# Patient Record
Sex: Male | Born: 1975 | Hispanic: Yes | Marital: Married | Smoking: Never smoker
Health system: Southern US, Community
[De-identification: ages and names within clinical notes are randomized; demographics above are authoritative.]

---

## 2015-12-18 ENCOUNTER — Encounter: Payer: Self-pay | Admitting: Orthopedic Surgery

## 2015-12-18 ENCOUNTER — Ambulatory Visit: Payer: Self-pay | Admitting: Orthopedic Surgery

## 2016-06-29 ENCOUNTER — Encounter (HOSPITAL_COMMUNITY): Payer: Self-pay | Admitting: *Deleted

## 2016-06-29 DIAGNOSIS — R071 Chest pain on breathing: Secondary | ICD-10-CM | POA: Insufficient documentation

## 2016-06-29 NOTE — ED Triage Notes (Signed)
THE PT IS C/O CENTRAL CHEST PAIN  FOR 7 NDAYS GRADUALLY GETTING WORSE  EACH DAY  .  WORSE TODAY SOME SOB AND DIZZINESS

## 2016-06-30 ENCOUNTER — Encounter (HOSPITAL_COMMUNITY): Payer: Self-pay | Admitting: Radiology

## 2016-06-30 ENCOUNTER — Emergency Department (HOSPITAL_COMMUNITY): Payer: Self-pay

## 2016-06-30 ENCOUNTER — Emergency Department (HOSPITAL_COMMUNITY)
Admission: EM | Admit: 2016-06-30 | Discharge: 2016-06-30 | Disposition: A | Discharge status: Home / Self Care | Payer: Self-pay | Attending: Emergency Medicine | Admitting: Emergency Medicine

## 2016-06-30 DIAGNOSIS — R079 Chest pain, unspecified: Secondary | ICD-10-CM

## 2016-06-30 LAB — BASIC METABOLIC PANEL
Anion gap: 5 (ref 5–15)
BUN: 8 mg/dL (ref 6–20)
CHLORIDE: 106 mmol/L (ref 101–111)
CO2: 27 mmol/L (ref 22–32)
CREATININE: 0.78 mg/dL (ref 0.61–1.24)
Calcium: 9.3 mg/dL (ref 8.9–10.3)
GFR calc Af Amer: >60 mL/min (ref >60)
GFR calc non Af Amer: >60 mL/min (ref >60)
GLUCOSE: 121 mg/dL — AB (ref 65–99)
POTASSIUM: 3.8 mmol/L (ref 3.5–5.1)
Sodium: 138 mmol/L (ref 135–145)

## 2016-06-30 LAB — I-STAT TROPONIN, ED: Troponin i, poc: 0.01 ng/mL (ref 0.00–0.08)

## 2016-06-30 LAB — D-DIMER, QUANTITATIVE: D-Dimer, Quant: 0.39 ug/mL-FEU (ref 0.00–0.50)

## 2016-06-30 LAB — CBC
HEMATOCRIT: 43.3 % (ref 39.0–52.0)
Hemoglobin: 14.7 g/dL (ref 13.0–17.0)
MCH: 28.8 pg (ref 26.0–34.0)
MCHC: 33.9 g/dL (ref 30.0–36.0)
MCV: 84.9 fL (ref 78.0–100.0)
PLATELETS: 299 10*3/uL (ref 150–400)
RBC: 5.1 MIL/uL (ref 4.22–5.81)
RDW: 13.6 % (ref 11.5–15.5)
WBC: 6.8 10*3/uL (ref 4.0–10.5)

## 2016-06-30 LAB — TROPONIN I: Troponin I: <0.03 ng/mL (ref <0.03)

## 2016-06-30 MED ORDER — SODIUM CHLORIDE 0.9 % IV BOLUS (SEPSIS)
1000.0000 mL | Freq: Once | INTRAVENOUS | Status: AC
  Administered 2016-06-30: 1000 mL via INTRAVENOUS

## 2016-06-30 MED ORDER — SODIUM CHLORIDE 0.9 % IV BOLUS (SEPSIS)
1000.0000 mL | INTRAVENOUS | Status: AC
  Administered 2016-06-30: 1000 mL via INTRAVENOUS

## 2016-06-30 MED ORDER — IOPAMIDOL (ISOVUE-370) INJECTION 76%
INTRAVENOUS | Status: AC
  Administered 2016-06-30: 100 mL
  Filled 2016-06-30: qty 100

## 2016-06-30 MED ORDER — CYCLOBENZAPRINE HCL 10 MG PO TABS: 10.0000 mg | ORAL_TABLET | Freq: Three times a day (TID) | ORAL | 0 refills | Status: DC | PRN

## 2016-06-30 NOTE — Discharge Instructions (Signed)
1. Medications: usual home medications 2. Treatment: rest, drink plenty of fluids,  3. Follow Up: Please followup with your primary doctor in 1-2 days for discussion of your diagnoses and further evaluation after today's visit; if you do not have a primary care doctor use the resource guide provided to find one; Please return to the ER for worsening pain or other concerns

## 2016-06-30 NOTE — ED Provider Notes (Signed)
[facility]EMER[facility]Y DEPT Provider Note   CSN: [id number] Arrival date & time: 06/29/16  2342     History   Chief Complaint Chief Complaint  Patient presents with  . Chest Pain    HPI Cory Melendez is a 40 y.o. male with no major medical problems, nonsmoker presents to the Emergency Department complaining of intermittent left sided chest pain onset 1 week ago, acutely worsening and becoming constant 3 days ago.  He rates his pain at 6/10.  Pt reports the pain is exacerbated by deep breathing and movement of the left shoulder.  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  Pt reports taking ibuprofen without moderate relief of the pain.  No cardiac hx, no hx of DVT, no recent immobilization, leg swelling, calf pain. Pt denies associated SOB, diaphoresis, nausea, vomiting, weakness, syncope.  Pt reports he did feel dizzy and lightheaded 2 days ago, But this has not returned.    The history is provided by the patient and medical records. No language interpreter was used.    History reviewed. No pertinent past medical history.  There are no active problems to display for this patient.   History reviewed. No pertinent surgical history.     Home Medications    Prior to Admission medications   Medication Sig Start Date End Date Taking? Authorizing Provider  ibuprofen (ADVIL,MOTRIN) 200 MG tablet Take 200 mg by mouth every 6 (six) hours as needed for mild pain.   Yes Historical Provider, MD    Family History No family history on file.  Social History Social History  Substance Use Topics  . Smoking status: Never Smoker  . Smokeless tobacco: Never Used  . Alcohol use No     Allergies   Review of patient's allergies indicates no known allergies.   Review of Systems Review of Systems  Cardiovascular: Positive for chest pain.  All other systems reviewed and are negative.    Physical Exam Updated Vital Signs BP 93/70   Pulse  (!) 54   Temp 97.7 F (36.5 C) (Oral)   Resp 12   Ht 5\' 3"  (1.6 m)   Wt 63 kg   SpO2 100%   BMI 24.62 kg/m   Physical Exam  Constitutional: He appears well-developed and well-nourished. No distress.  Awake, alert, nontoxic appearance  HENT:  Head: Normocephalic and atraumatic.  Mouth/Throat: Oropharynx is clear and moist. No oropharyngeal exudate.  Eyes: Conjunctivae are normal. No scleral icterus.  Neck: Normal range of motion. Neck supple.  Cardiovascular: Normal rate, regular rhythm and intact distal pulses.   Pulmonary/Chest: Effort normal and breath sounds normal. No respiratory distress. He has no wheezes. He exhibits tenderness.    Equal chest expansion Clear and equal breath sounds Tenderness to palpation in the left chest  Abdominal: Soft. Bowel sounds are normal. He exhibits no mass. There is no tenderness. There is no rebound and no guarding.  Musculoskeletal: Normal range of motion. He exhibits no edema.  Left shoulder: Full range of motion with exacerbation of left-sided chest pain but no shoulder or arm pain. No joint line tenderness.  Neurological: He is alert.  Speech is clear and goal oriented Moves extremities without ataxia  Skin: Skin is warm and dry. He is not diaphoretic.  Psychiatric: He has a normal mood and affect.  Nursing note and vitals reviewed.    ED Treatments / Results  Labs (all labs ordered are listed, but only abnormal results are displayed)  Labs Reviewed  BASIC METABOLIC PANEL - Abnormal; Notable for the following:       Result Value   Glucose, Bld 121 (*)    All other components within normal limits  CBC  TROPONIN I  D-DIMER, QUANTITATIVE (NOT AT [facility])  I-STAT TROPOININ, ED    EKG  EKG Interpretation  Date/Time:  Saturday June 29 2016 23:47:16 EDT Ventricular Rate:  70 PR Interval:  156 QRS Duration: 88 QT Interval:  396 QTC Calculation: 427 R Axis:   -30 Text Interpretation:  Normal sinus rhythm Left axis  deviation Abnormal ECG No old tracing to compare Confirmed by GOLDSTON MD, SCOTT [phone]) on 06/30/2016 12:53:35 AM       Radiology Dg Chest 2 View  Result Date: 06/30/2016 CLINICAL DATA:  Intermittent chest pain for 7 days. Shortness of breath and dizziness. EXAM: CHEST  2 VIEW COMPARISON:  None. FINDINGS: Shallow inspiration. The heart size and mediastinal contours are within normal limits. Both lungs are clear. The visualized skeletal structures are unremarkable. IMPRESSION: No active cardiopulmonary disease. Electronically Signed   By: Burman Nieves M.D.   On: 06/30/2016 01:16   Ct Angio Chest/abd/pel For Dissection W And/or W/wo  Result Date: 06/30/2016 CLINICAL DATA:  Chest pain radiating to LEFT back, hypotension for 1 week. EXAM: CT ANGIOGRAPHY CHEST, ABDOMEN AND PELVIS TECHNIQUE: Multidetector CT imaging through the chest, abdomen and pelvis was performed using the standard protocol during bolus administration of intravenous contrast. Multiplanar reconstructed images and MIPs were obtained and reviewed to evaluate the vascular anatomy. CONTRAST:  100 cc Isovue 370 COMPARISON:  Chest radiograph June 30, 2016 at 0042 hours FINDINGS: CTA CHEST FINDINGS CARDIOVASCULAR: Cardiac motion through root of aorta. Thoracic aorta is normal course and caliber. No intrinsic density on noncontrast CT. Homogeneous contrast opacification of thoracic aorta without dissection, aneurysm, luminal irregularity, periaortic fluid collections, or contrast extravasation. Heart size is normal. No pericardial effusion. MEDIASTINUM/NODES: No mediastinal mass or lymphadenopathy by CT size criteria. Small calcified LEFT mediastinal lymph nodes. LUNGS/PLEURA: Tracheobronchial tree is patent, no pneumothorax. Low lung volumes. Dependent atelectasis without pleural effusion or focal consolidation. MUSCULOSKELETAL:  Nonsuspicious.  Negative thoracic spine. Review of the MIP images confirms the above findings. CTA ABDOMEN AND  PELVIS FINDINGS ARTERIES: Abdominal aorta is normal course and caliber. Trace calcific atherosclerosis Homogeneous contrast opacification of aortoiliac vessels without dissection, aneurysm, luminal irregularity, periaortic fluid collections, or contrast extravasation. Celiac axis, superior and inferior mesenteric arteries are patent. Mild neural foraminal narrowing SMA origin. HEPATOBILIARY: Liver and gallbladder are normal. PANCREAS: Normal. SPLEEN: Normal. ADRENALS/URINARY TRACT: Kidneys are orthotopic, demonstrating symmetric enhancement. No nephrolithiasis, hydronephrosis or solid renal masses. 10 mm RIGHT lower pole exophytic cyst. The unopacified ureters are normal in course and caliber. Urinary bladder is partially distended and unremarkable. Normal adrenal glands. STOMACH/BOWEL: The stomach, small and large bowel are normal in course and caliber without inflammatory changes. Mild colonic diverticulosis. Normal appendix. VASCULAR/LYMPHATIC: No lymphadenopathy by CT size criteria. REPRODUCTIVE: Prostate size is normal. OTHER: No intraperitoneal free fluid or free air. MUSCULOSKELETAL: Nonacute.  Negative lumbar spine. Review of the MIP images confirms the above findings. IMPRESSION: CTA CHEST: No acute vascular process or acute cardiopulmonary disease. CTA ABDOMEN AND PELVIS: No acute vascular process or or acute intra-abdominal/pelvic disease. Mild SMA origin stenosis. Mild colonic diverticulosis. Electronically Signed   By: Awilda Metro M.D.   On: 06/30/2016 06:11    Procedures Procedures (including critical care time)  Medications Ordered in ED Medications  sodium chloride 0.9 %  bolus 1,000 mL (0 mLs Intravenous Stopped 06/30/16 0324)  sodium chloride 0.9 % bolus 1,000 mL (0 mLs Intravenous Stopped 06/30/16 0438)  sodium chloride 0.9 % bolus 1,000 mL (0 mLs Intravenous Stopped 06/30/16 0513)  iopamidol (ISOVUE-370) 76 % injection (100 mLs  Contrast Given 06/30/16 0531)     Initial Impression  / Assessment and Plan / ED Course  I have reviewed the triage vital signs and the nursing notes.  Pertinent labs & imaging results that were available during my care of the patient were reviewed by me and considered in my medical decision making (see chart for details).  Clinical Course  Value Comment By Time   BP:  Right arm - 97/74 Left arm - 99/76 Dierdre Forth, PA-C 09/24 0200   Patient remains hypotensive after second liter of fluid. Dahlia Client Sakiyah Shur, PA-C 09/24 0408  D-Dimer, Quant: 0.39 Negative. Dahlia Client Burna Atlas, PA-C 09/24 0408   Troponin is negative. Labs reassuring. Dahlia Client Anaira Seay, PA-C 09/24 0408  DG Chest 2 View No acute abnormalities. Dahlia Client Anson Peddie, PA-C 09/24 0408  EKG 12-Lead Normal sinus rhythm with left axis deviation. No old for comparison. Nonischemic. Dahlia Client Treshawn Allen, PA-C 09/24 0409  Troponin i, poc: 0.01 Initial and delta troponin negative.  Dahlia Client Ksenia Kunz, PA-C 09/24 [phone]  D-Dimer, Quant: 0.39 D-dimer negative.  Dahlia Client Maverik Foot, PA-C 09/24 0649  WBC: 6.8 No leukocytosis or anemia. Dierdre Forth, PA-C 09/24 6213  Creatinine: 0.78 No AKI Dierdre Forth, PA-C 09/24 0865  CT Angio Chest/Abd/Pel for Dissection W and/or W/WO No evidence of coronary embolism or dissection. Dierdre Forth, PA-C 09/24 [phone]  DG Chest 2 View Unremarkable [place], [place] 09/24 [phone]    Pt with Left-sided chest pain. No evidence of ACS. Heart score 1.  Patient is low risk.  Labs and imaging are reassuring here in the emergency department. No evidence of pneumothorax, pulmonary embolism, dissection. Pain appears to be musculoskeletal as it is both somewhat reproducible and aggravated by movement of the left arm and shoulder.  Patient remains slightly hypotensive with systolic blood pressures in the 90s. He has no dizziness, lightheadedness, nausea or vomiting. He ambulates the emergency department with steady gait. He does not feel  lightheaded when he stands or walks.  Patient is overall well appearing. He has been given fluids. Discussed importance of fluid hydration and follow-up with his primary care for further evaluation of his blood pressure. Also discussed reasons to return to the emergency department including syncope, worsening chest pain or other concerns. Patient will be given muscle relaxer and anti-inflammatories for potential chest wall pain.  Final Clinical Impressions(s) / ED Diagnoses   Final diagnoses:  Chest pain, unspecified chest pain type    New Prescriptions New Prescriptions   No medications on file     Dierdre Forth, PA-C 06/30/16 [id number]    Pricilla Loveless, MD 07/03/16 1322

## 2016-06-30 NOTE — ED Notes (Signed)
Patient transported to X-ray 

## 2016-08-21 DIAGNOSIS — Z139 Encounter for screening, unspecified: Secondary | ICD-10-CM

## 2016-08-21 LAB — GLUCOSE, POCT (MANUAL RESULT ENTRY): POC GLUCOSE: 88 mg/dL (ref 70–99)

## 2016-08-21 NOTE — Congregational Nurse Program (Signed)
Patient was scheduled for a flu vaccine and request for primary care provider. Patients vitals were 107/72 80 and 98.1 via oral source. Flu vaccine information was given to look over and read, as well as was able to take home. Consent form was filled out and signed by patient. Vaccine given to left deltoitd area. Vaccine information: Lot: [id number] MFG: [employer] NDC: [id number] Expires: 03/13/2017. Patient mentioned he had family history (mother) with diabetes and a glucose check was done 6288 was result. While waiting in the exam room patient did not present and adverse reaction to the flu vaccine. [facility] at 9:30 am.    [place]anca Suren Payne LPN [phone]

## 2016-09-03 ENCOUNTER — Ambulatory Visit: Payer: Self-pay | Admitting: Physician Assistant

## 2016-09-05 ENCOUNTER — Telehealth: Payer: Self-pay

## 2016-09-05 NOTE — Telephone Encounter (Signed)
Follow up call was attempted at 1620 on 09/05/16. Message was not left due to voicemail not being set up. Follow up call was made to see how patient was doing, and how [facility] went [facility]   [place] Park[place]anca Brinkley Peet, [place] [phone]

## 2016-09-11 ENCOUNTER — Encounter: Payer: Self-pay | Admitting: Physician Assistant

## 2016-11-03 ENCOUNTER — Encounter (HOSPITAL_COMMUNITY): Payer: Self-pay | Admitting: Emergency Medicine

## 2016-11-03 ENCOUNTER — Emergency Department (HOSPITAL_COMMUNITY)
Admission: EM | Admit: 2016-11-03 | Discharge: 2016-11-03 | Disposition: A | Discharge status: Home / Self Care | Payer: Self-pay | Attending: Emergency Medicine | Admitting: Emergency Medicine

## 2016-11-03 ENCOUNTER — Emergency Department (HOSPITAL_COMMUNITY): Payer: Self-pay

## 2016-11-03 DIAGNOSIS — J111 Influenza due to unidentified influenza virus with other respiratory manifestations: Secondary | ICD-10-CM | POA: Insufficient documentation

## 2016-11-03 MED ORDER — NAPROXEN 500 MG PO TABS: 500.0000 mg | ORAL_TABLET | Freq: Two times a day (BID) | ORAL | 0 refills | Status: DC

## 2016-11-03 MED ORDER — BENZONATATE 100 MG PO CAPS: 100.0000 mg | ORAL_CAPSULE | Freq: Three times a day (TID) | ORAL | 0 refills | Status: DC

## 2016-11-03 MED ORDER — IBUPROFEN 800 MG PO TABS
800.0000 mg | ORAL_TABLET | Freq: Once | ORAL | Status: AC
  Administered 2016-11-03: 800 mg via ORAL
  Filled 2016-11-03: qty 1

## 2016-11-03 MED ORDER — BENZONATATE 100 MG PO CAPS
200.0000 mg | ORAL_CAPSULE | Freq: Once | ORAL | Status: AC
  Administered 2016-11-03: 200 mg via ORAL
  Filled 2016-11-03: qty 2

## 2016-11-03 NOTE — ED Provider Notes (Signed)
[facility] Provider Note   CSN: [id number] Arrival date & time: 11/03/16  1200     History   Chief Complaint Chief Complaint  Patient presents with  . Influenza    HPI Cory Melendez is a 41 y.o. male. CC:  Fever and cough  HPI:  Patient presents with four-day illness. Cough headache sore throat and body aches. He did receive a flu vaccine last fall". Scant sputum. No blood. No GI complaints. Diffuse myalgias.  History reviewed. No pertinent past medical history.  There are no active problems to display for this patient.   History reviewed. No pertinent surgical history.     Home Medications    Prior to Admission medications   Medication Sig Start Date End Date Taking? Authorizing Provider  benzonatate (TESSALON) 100 MG capsule Take 1 capsule (100 mg total) by mouth every 8 (eight) hours. 11/03/16   Rolland Porter, MD  cyclobenzaprine (FLEXERIL) 10 MG tablet Take 1 tablet (10 mg total) by mouth 3 (three) times daily as needed for muscle spasms. 06/30/16   Pricilla Loveless, MD  ibuprofen (ADVIL,MOTRIN) 200 MG tablet Take 200 mg by mouth every 6 (six) hours as needed for mild pain.    Historical Provider, MD  naproxen (NAPROSYN) 500 MG tablet Take 1 tablet (500 mg total) by mouth 2 (two) times daily. 11/03/16   Rolland Porter, MD    Family History No family history on file.  Social History Social History  Substance Use Topics  . Smoking status: Never Smoker  . Smokeless tobacco: Never Used  . Alcohol use No     Allergies   Patient has no known allergies.   Review of Systems Review of Systems  Constitutional: Positive for fatigue and fever. Negative for appetite change, chills and diaphoresis.  HENT: Positive for sore throat. Negative for mouth sores and trouble swallowing.   Eyes: Negative for visual disturbance.  Respiratory: Positive for cough. Negative for chest tightness and wheezing.   Cardiovascular: Negative for chest pain.  Gastrointestinal: Negative  for abdominal distention, abdominal pain, diarrhea, nausea and vomiting.  Endocrine: Negative for polydipsia, polyphagia and polyuria.  Genitourinary: Negative for dysuria, frequency and hematuria.  Musculoskeletal: Negative for gait problem.  Skin: Negative for color change, pallor and rash.  Neurological: Positive for headaches. Negative for dizziness, syncope and light-headedness.  Hematological: Does not bruise/bleed easily.  Psychiatric/Behavioral: Negative for behavioral problems and confusion.     Physical Exam Updated Vital Signs BP 104/84   Pulse 66   Temp 98.6 F (37 C) (Oral)   Resp 14   Ht 5\' 3"  (1.6 m)   Wt 137 lb (62.1 kg)   SpO2 97%   BMI 24.27 kg/m   Physical Exam  Constitutional: He is oriented to person, place, and time. He appears well-developed and well-nourished. No distress.  HENT:  Head: Normocephalic.  Normal pharynx. No erythema or exudate. No adenopathy in the neck  Eyes: Conjunctivae are normal. Pupils are equal, round, and reactive to light. No scleral icterus.  Neck: Normal range of motion. Neck supple. No thyromegaly present.  Cardiovascular: Normal rate and regular rhythm.  Exam reveals no gallop and no friction rub.   No murmur heard. Pulmonary/Chest: Effort normal and breath sounds normal. No respiratory distress. He has no wheezes. He has no rales.  Clear bilateral breath sounds  Abdominal: Soft. Bowel sounds are normal. He exhibits no distension. There is no tenderness. There is no rebound.  Musculoskeletal: Normal range of motion.  Neurological: He is  alert and oriented to person, place, and time.  Skin: Skin is warm and dry. No rash noted.  Psychiatric: He has a normal mood and affect. His behavior is normal.     ED Treatments / Results  Labs (all labs ordered are listed, but only abnormal results are displayed) Labs Reviewed - No data to display  EKG  EKG Interpretation None       Radiology Dg Chest 2 View  Result Date:  11/03/2016 CLINICAL DATA:  Dry cough and chest pain for several days EXAM: CHEST  2 VIEW COMPARISON:  06/30/2016 FINDINGS: Cardiac shadow is within normal limits. The lungs are well aerated bilaterally. No focal infiltrate or sizable effusion is seen. Calcified mediastinal lymph nodes are noted consistent with prior granulomatous disease. No acute bony abnormality is seen. IMPRESSION: No active cardiopulmonary disease. Electronically Signed   By: Alcide CleverMark  Lukens M.D.   On: 11/03/2016 16:32    Procedures Procedures (including critical care time)  Medications Ordered in ED Medications  ibuprofen (ADVIL,MOTRIN) tablet 800 mg (800 mg Oral Given 11/03/16 1552)  benzonatate (TESSALON) capsule 200 mg (200 mg Oral Given 11/03/16 1552)     Initial Impression / Assessment and Plan / ED Course  I have reviewed the triage vital signs and the nursing notes.  Pertinent labs & imaging results that were available during my care of the patient were reviewed by me and considered in my medical decision making (see chart for details).     We'll plan chest x-ray rule out pneumonia. Then have  "the influenza talk".  Final Clinical Impressions(s) / ED Diagnoses   Final diagnoses:  Influenza   X-ray negative. Patient overall appears well. We discussed Tamiflu and its potential side effects and lack of effectiveness beyond 48 hours. He is not interpreting treated with Tamiflu. We'll give naproxen, and Tessalon for cough. Rest fluids anti-inflammatories primary care follow-up.   New Prescriptions New Prescriptions   BENZONATATE (TESSALON) 100 MG CAPSULE    Take 1 capsule (100 mg total) by mouth every 8 (eight) hours.   NAPROXEN (NAPROSYN) 500 MG TABLET    Take 1 tablet (500 mg total) by mouth 2 (two) times daily.     Rolland PorterMark Hermie Reagor, MD 11/03/16 [phone]

## 2016-11-03 NOTE — ED Triage Notes (Signed)
Pt reports chills, cough, body aches and sore throat x4 days, reports his brother was dx with the flu. Resp e/u, skin warm and dry, nad.

## 2016-11-03 NOTE — ED Notes (Signed)
Pt. returned from XR. 

## 2016-11-03 NOTE — ED Notes (Signed)
Patient transported to X-ray 

## 2017-09-28 IMAGING — DX DG CHEST 2V
2 series · 2 of 2 positions shown · non-contrast
Comparison: None.

CLINICAL DATA: Intermittent chest pain for 7 days. Shortness of
breath and dizziness.

EXAM:
CHEST  2 VIEW

[chest pa]
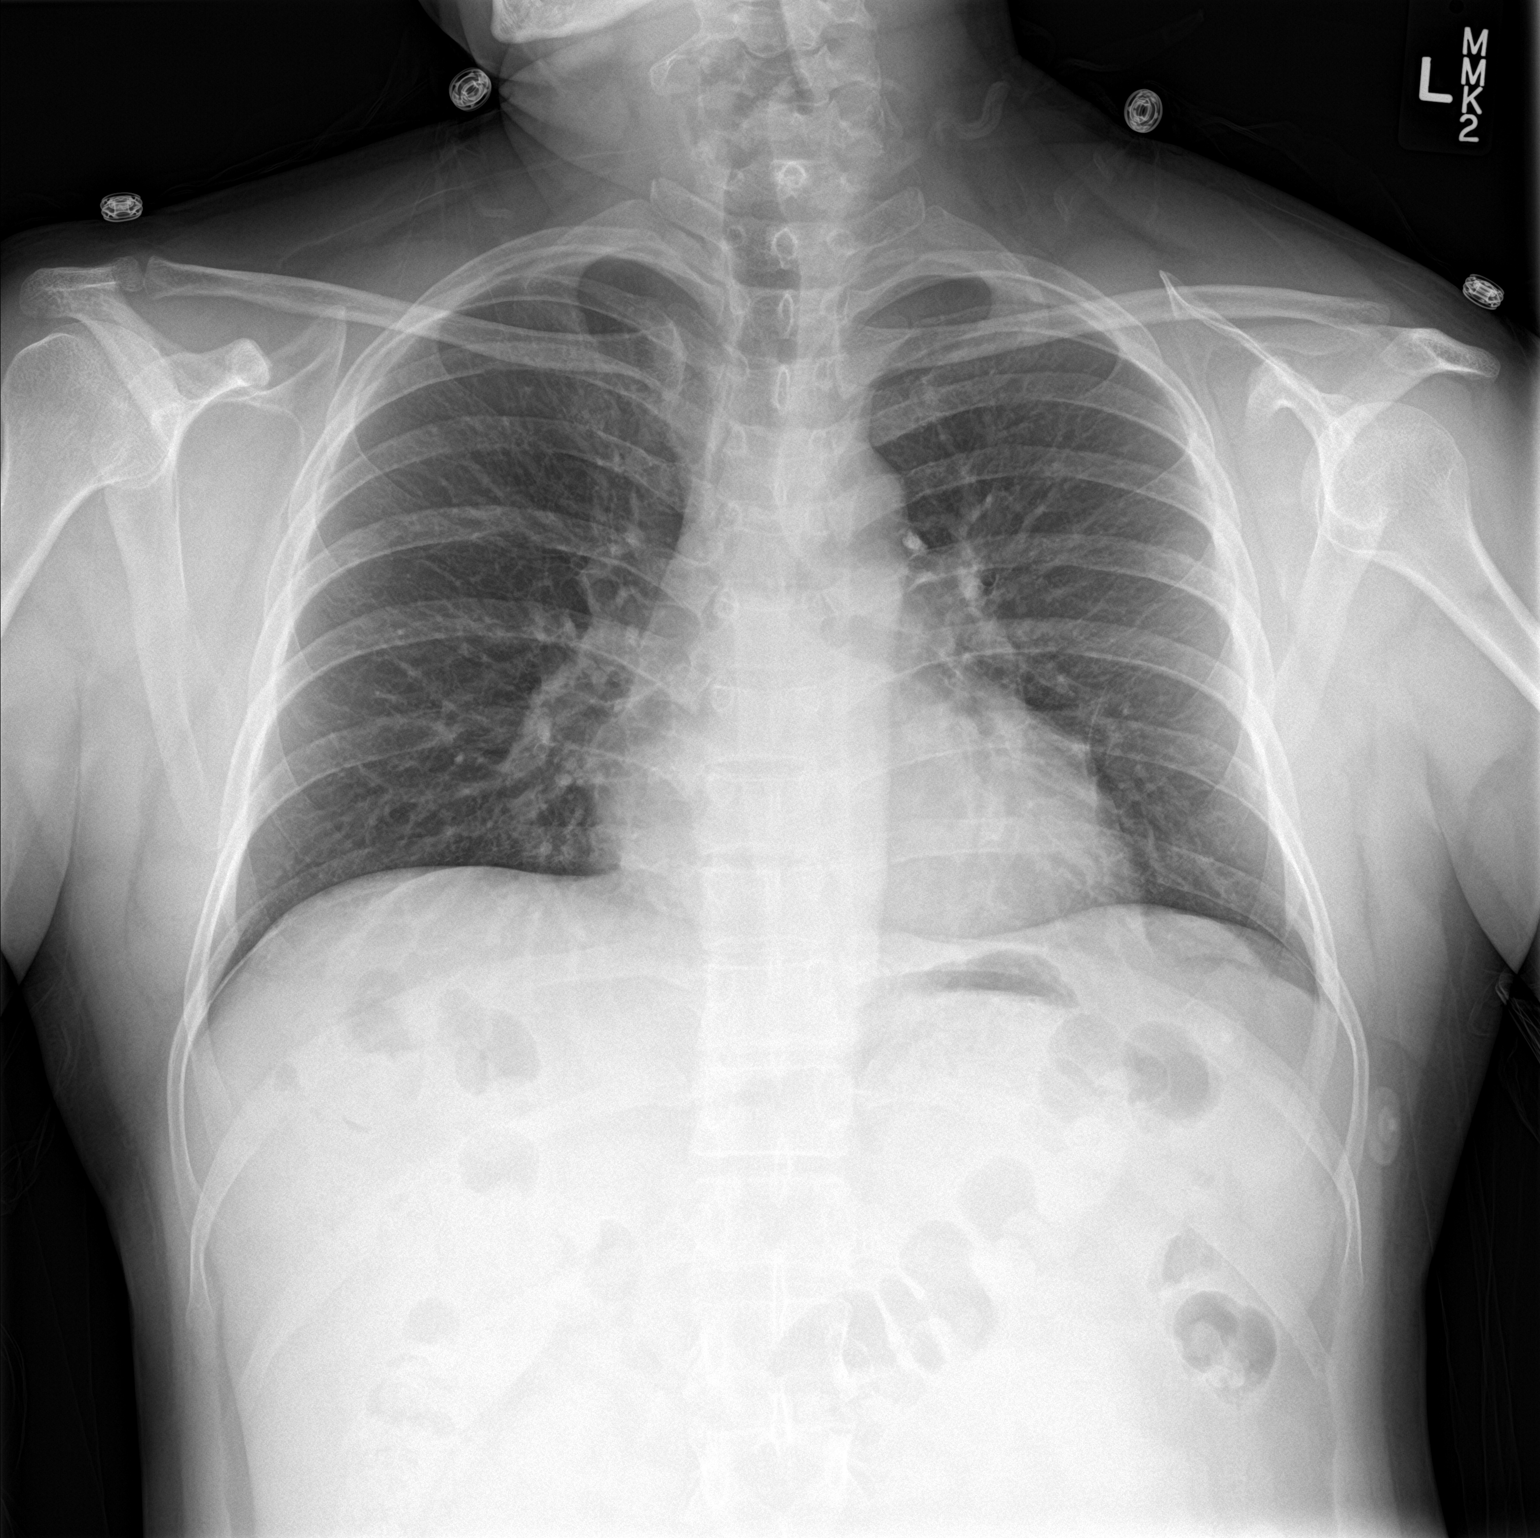

[chest lat]
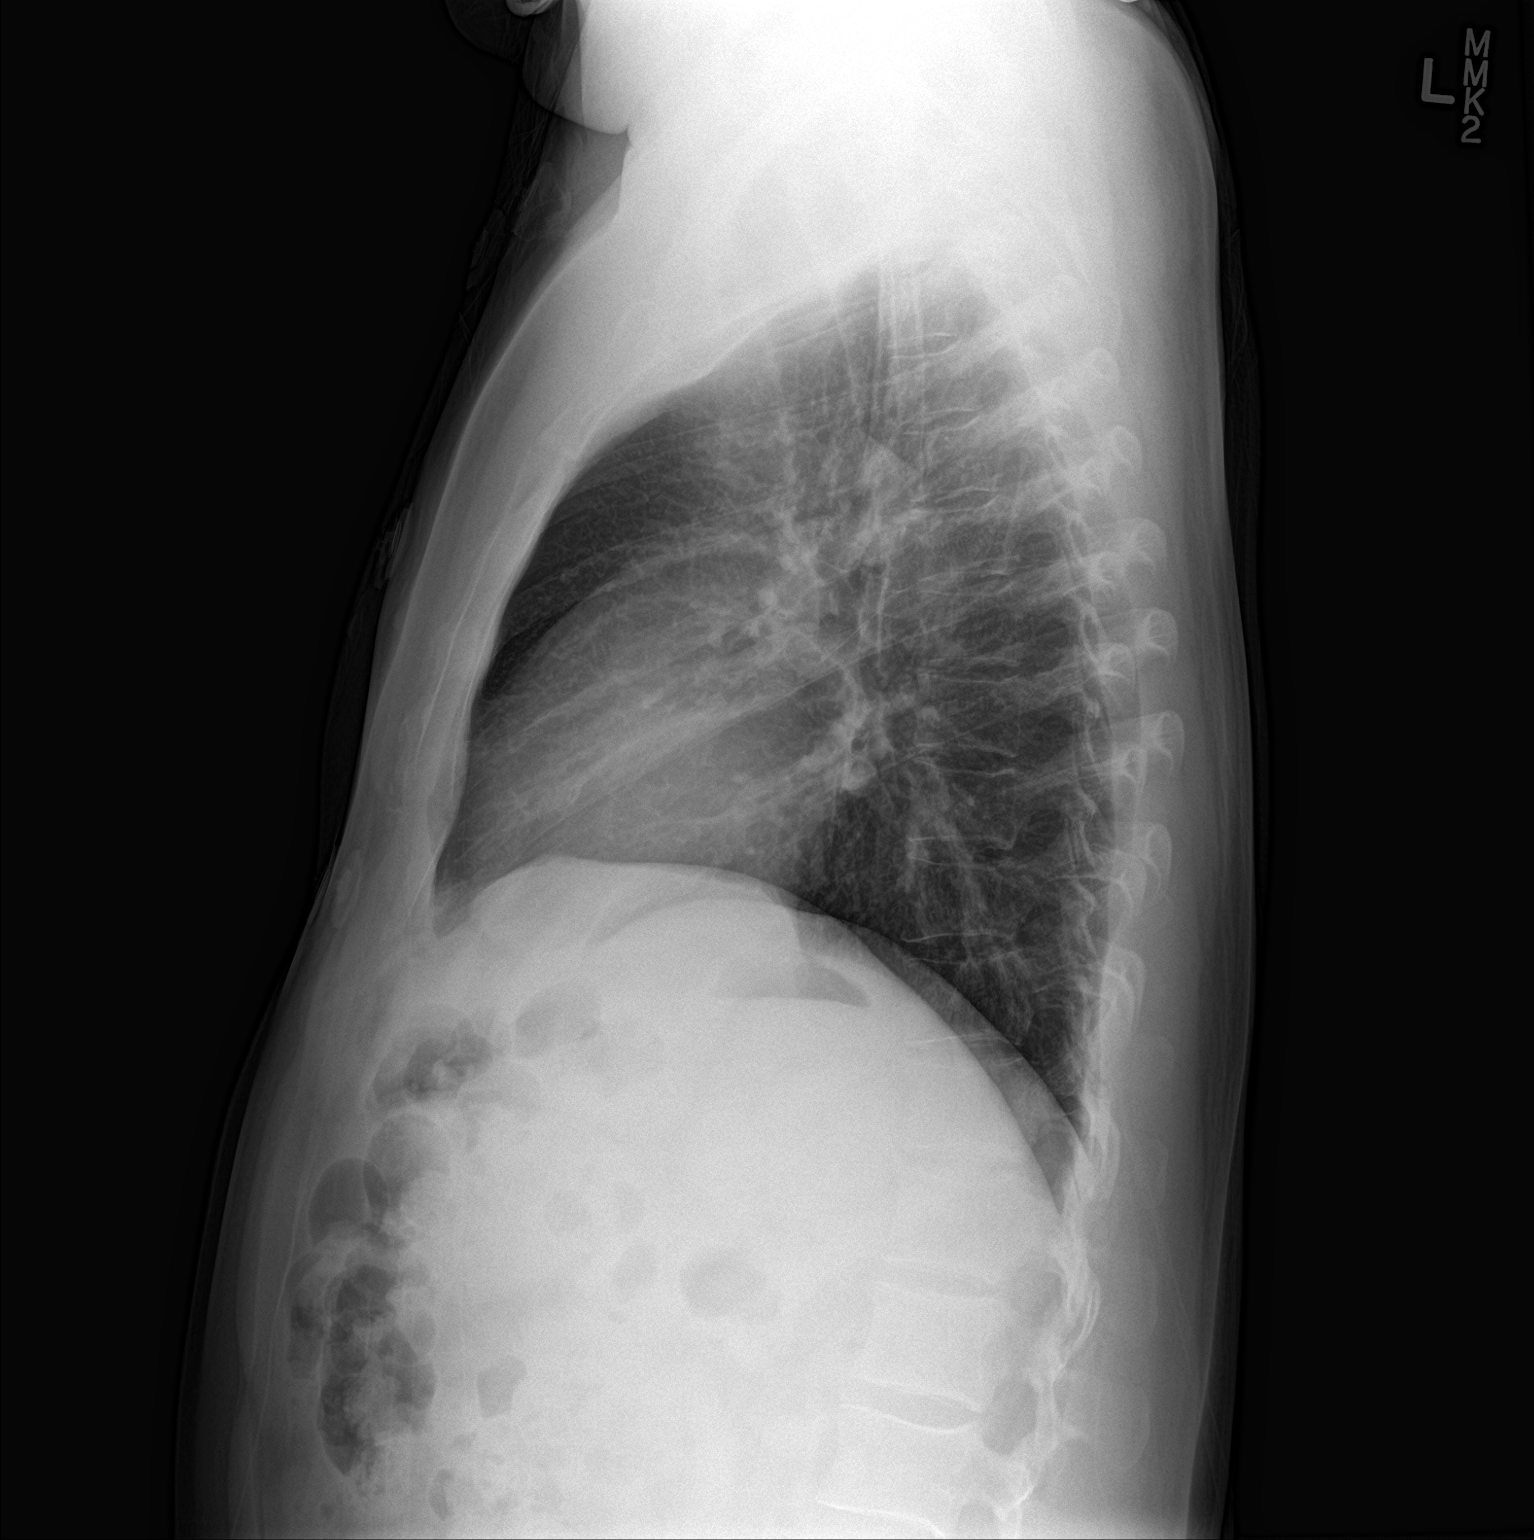

[2 of 2 positions shown; findings below may reference images not displayed]

FINDINGS: Shallow inspiration. The heart size and mediastinal contours are
within normal limits. Both lungs are clear. The visualized skeletal
structures are unremarkable.
IMPRESSION: No active cardiopulmonary disease.

## 2017-09-28 IMAGING — CT CT ANGIO CHEST-ABD-PELV FOR DISSECTION W/ AND WO/W CM
2 of 9 series · 14 of 46 positions shown, 16 images · IV contrast (isovue)
Comparison: Chest radiograph June 30, 2016 at 6611 hours

CLINICAL DATA: Chest pain radiating to LEFT back, hypotension for 1
week.

EXAM:
CT ANGIOGRAPHY CHEST, ABDOMEN AND PELVIS
TECHNIQUE: Multidetector CT imaging through the chest, abdomen and pelvis was
performed using the standard protocol during bolus administration of
intravenous contrast. Multiplanar reconstructed images and MIPs were
obtained and reviewed to evaluate the vascular anatomy.
CONTRAST:  100 cc Isovue 370

[Series 5: dissection 3.0 i30f 3 · axial · 0.69mm/px · z∈[+602,+1157]mm · 11 of 209 slices shown, 13 images]
[im 12/209  soft-tissue]
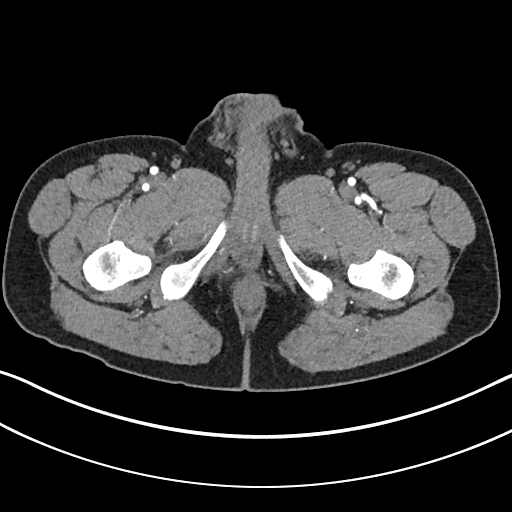
[im 12/209  bone]
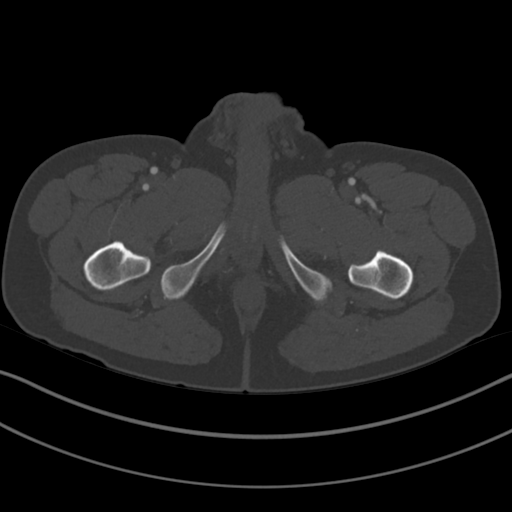
[im 35/209  soft-tissue]
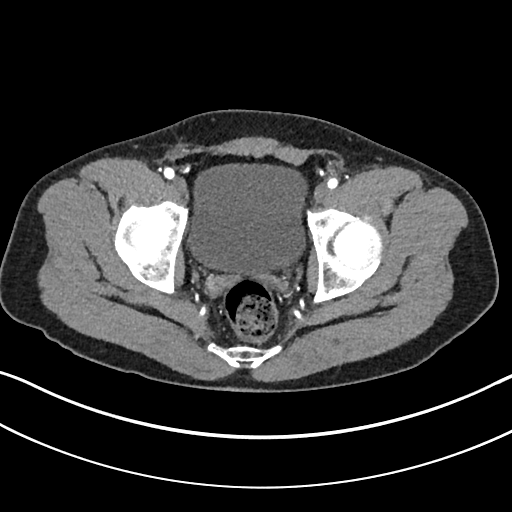
[im 47/209  soft-tissue]
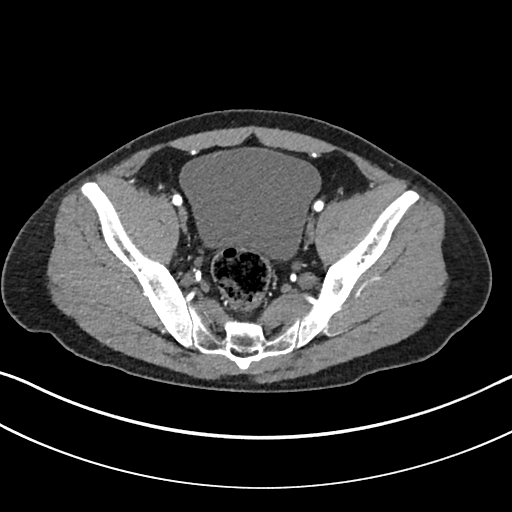
[im 70/209  soft-tissue]
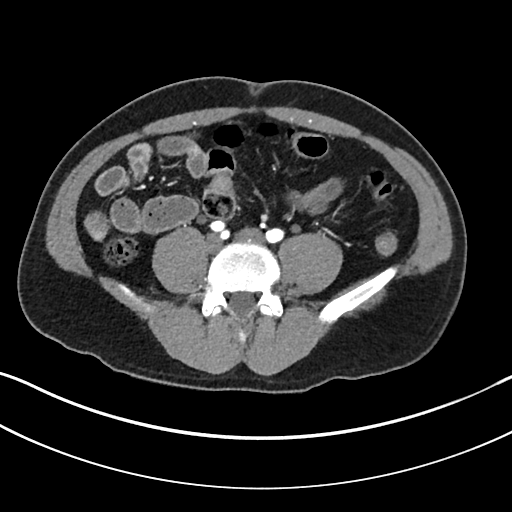
[im 81/209  soft-tissue]
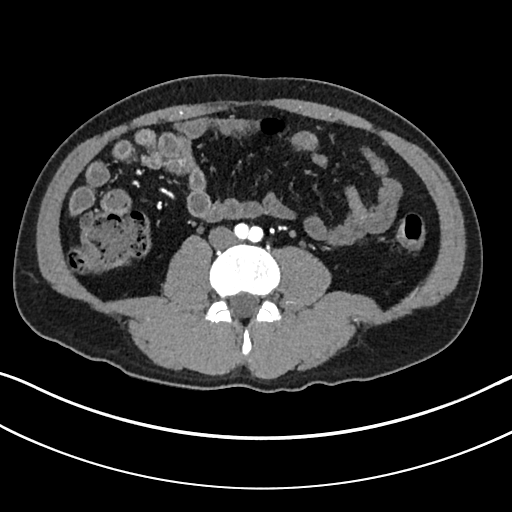
[im 105/209  soft-tissue]
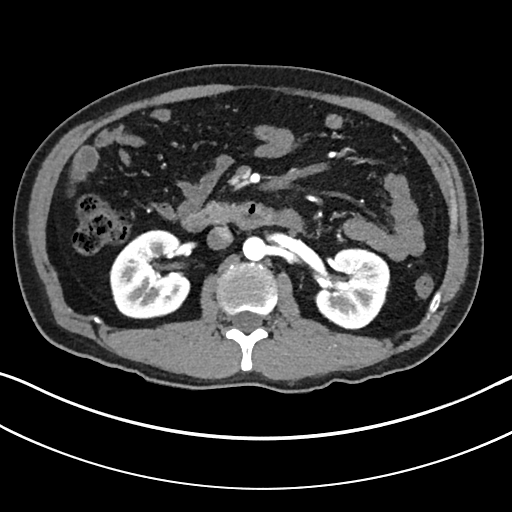
[im 128/209  soft-tissue]
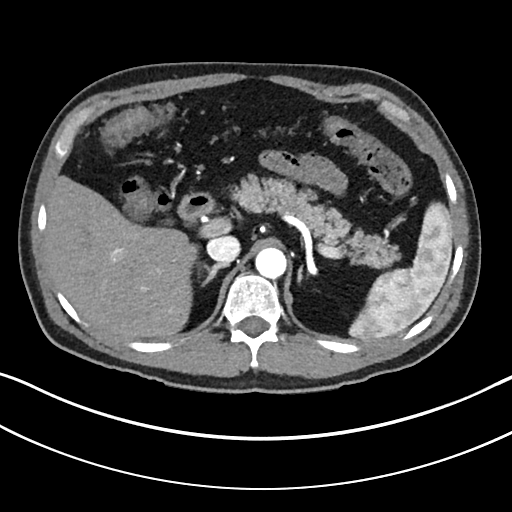
[im 139/209  soft-tissue]
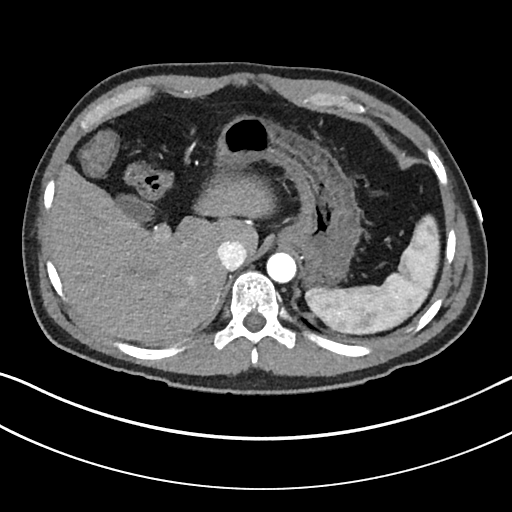
[im 162/209  soft-tissue]
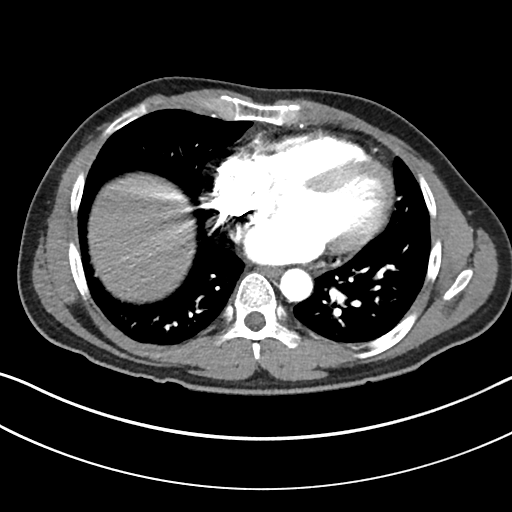
[im 162/209  bone]
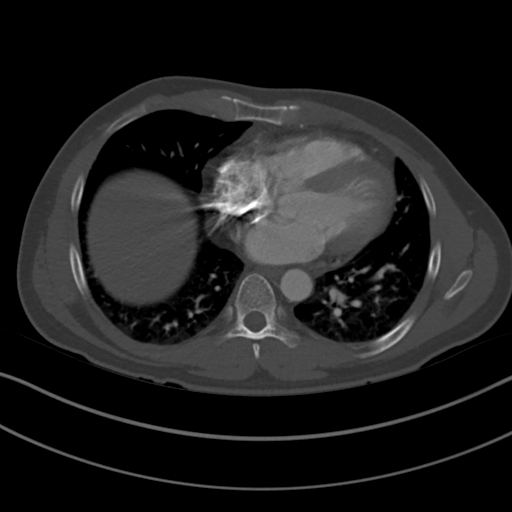
[im 174/209  soft-tissue]
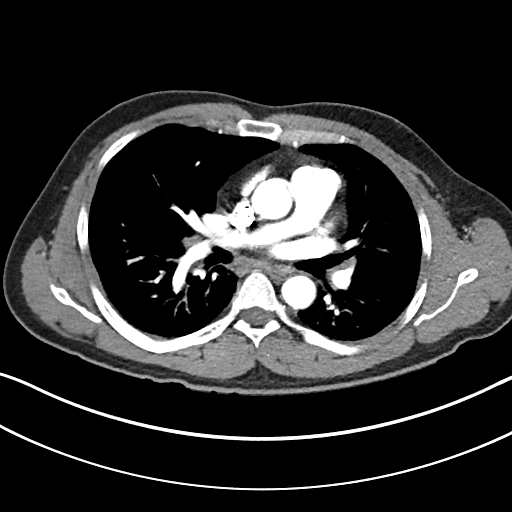
[im 197/209  soft-tissue]
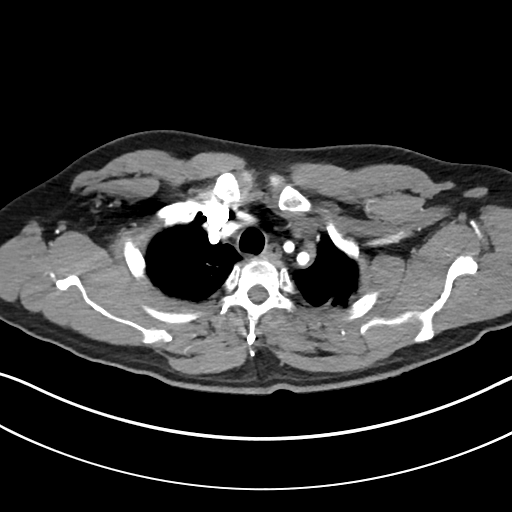

[Series 8: coronals · coronal · 0.71mm/px · 3 of 119 slices shown]
[im 30/119  soft-tissue]
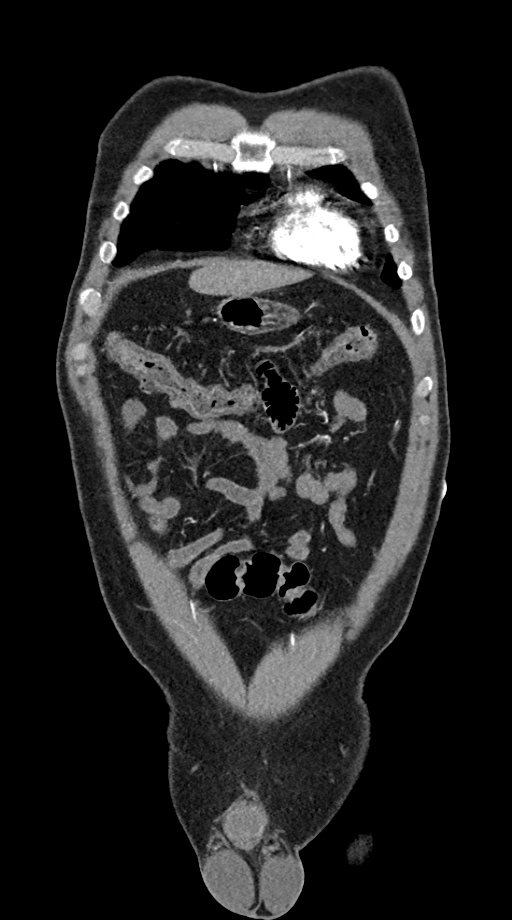
[im 60/119  soft-tissue]
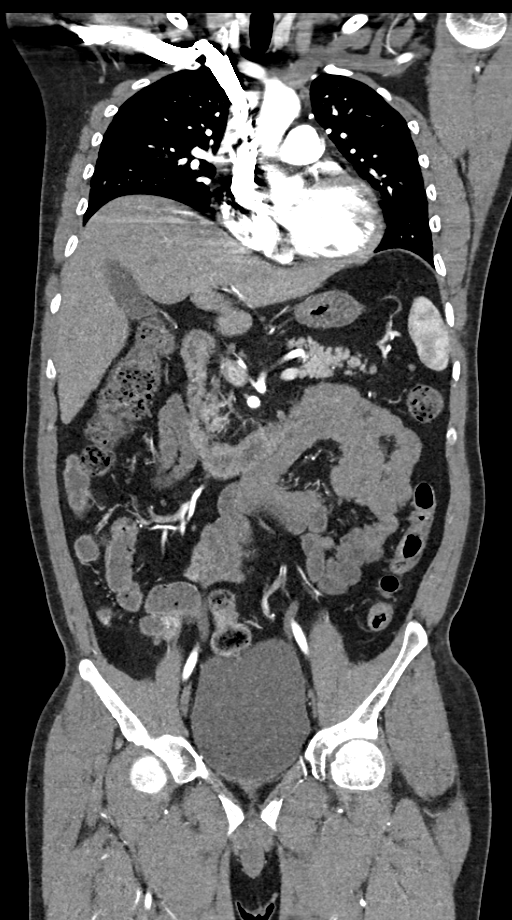
[im 89/119  soft-tissue]
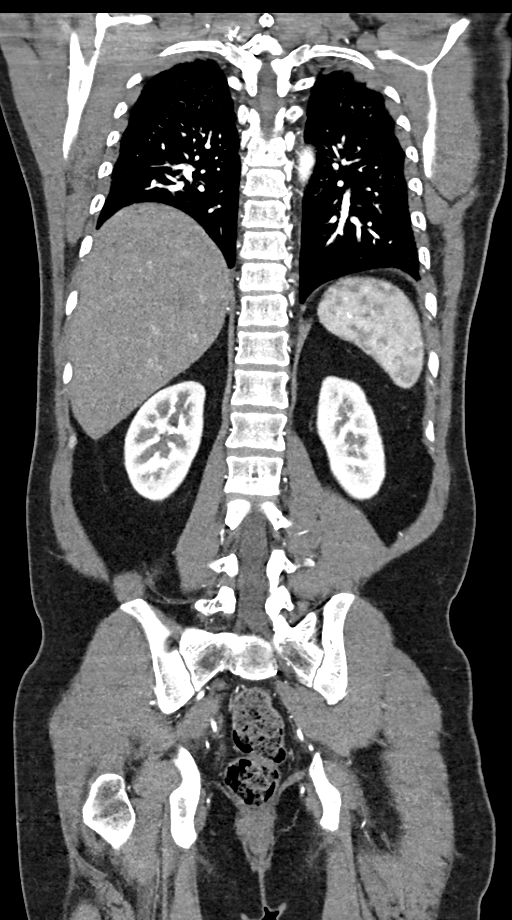

[14 of 46 positions shown; findings below may reference images not displayed]

FINDINGS: CTA CHEST FINDINGS

CARDIOVASCULAR: Cardiac motion through root of aorta. Thoracic aorta
is normal course and caliber. No intrinsic density on noncontrast
CT. Homogeneous contrast opacification of thoracic aorta without
dissection, aneurysm, luminal irregularity, periaortic fluid
collections, or contrast extravasation. Heart size is normal. No
pericardial effusion.

MEDIASTINUM/NODES: No mediastinal mass or lymphadenopathy by CT size
criteria. Small calcified LEFT mediastinal lymph nodes.

LUNGS/PLEURA: Tracheobronchial tree is patent, no pneumothorax. Low
lung volumes. Dependent atelectasis without pleural effusion or
focal consolidation.

MUSCULOSKELETAL:  Nonsuspicious.  Negative thoracic spine.

Review of the MIP images confirms the above findings.

CTA ABDOMEN AND PELVIS FINDINGS

ARTERIES: Abdominal aorta is normal course and caliber. Trace
calcific atherosclerosis Homogeneous contrast opacification of
aortoiliac vessels without dissection, aneurysm, luminal
irregularity, periaortic fluid collections, or contrast
extravasation. Celiac axis, superior and inferior mesenteric
arteries are patent. Mild neural foraminal narrowing SMA origin.

HEPATOBILIARY: Liver and gallbladder are normal.

PANCREAS: Normal.

SPLEEN: Normal.

ADRENALS/URINARY TRACT: Kidneys are orthotopic, demonstrating
symmetric enhancement. No nephrolithiasis, hydronephrosis or solid
renal masses. 10 mm RIGHT lower pole exophytic cyst. The unopacified
ureters are normal in course and caliber. Urinary bladder is
partially distended and unremarkable. Normal adrenal glands.

STOMACH/BOWEL: The stomach, small and large bowel are normal in
course and caliber without inflammatory changes. Mild colonic
diverticulosis. Normal appendix.

VASCULAR/LYMPHATIC: No lymphadenopathy by CT size criteria.

REPRODUCTIVE: Prostate size is normal.

OTHER: No intraperitoneal free fluid or free air.

MUSCULOSKELETAL: Nonacute.  Negative lumbar spine.

Review of the MIP images confirms the above findings.
IMPRESSION: CTA CHEST: No acute vascular process or acute cardiopulmonary
disease.

CTA ABDOMEN AND PELVIS: No acute vascular process or or acute
intra-abdominal/pelvic disease. Mild SMA origin stenosis.

Mild colonic diverticulosis.

## 2020-11-07 ENCOUNTER — Ambulatory Visit
Admission: EM | Admit: 2020-11-07 | Discharge: 2020-11-07 | Disposition: A | Discharge status: Home / Self Care | Payer: Self-pay | Attending: Family Medicine | Admitting: Family Medicine

## 2020-11-07 ENCOUNTER — Encounter: Payer: Self-pay | Admitting: Emergency Medicine

## 2020-11-07 ENCOUNTER — Other Ambulatory Visit: Payer: Self-pay

## 2020-11-07 DIAGNOSIS — S161XXA Strain of muscle, fascia and tendon at neck level, initial encounter: Secondary | ICD-10-CM

## 2020-11-07 DIAGNOSIS — R519 Headache, unspecified: Secondary | ICD-10-CM

## 2020-11-07 DIAGNOSIS — M542 Cervicalgia: Secondary | ICD-10-CM

## 2020-11-07 DIAGNOSIS — M546 Pain in thoracic spine: Secondary | ICD-10-CM

## 2020-11-07 MED ORDER — IBUPROFEN 800 MG PO TABS: 800.0000 mg | ORAL_TABLET | Freq: Three times a day (TID) | ORAL | 0 refills | Status: DC | PRN

## 2020-11-07 MED ORDER — CYCLOBENZAPRINE HCL 10 MG PO TABS: 10.0000 mg | ORAL_TABLET | Freq: Two times a day (BID) | ORAL | 0 refills | Status: DC | PRN
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
## 2020-11-07 NOTE — Discharge Instructions (Signed)
Take ibuprofen as needed for your pain.    Take the muscle relaxer Flexeril as needed for muscle spasm; Do not drive, operate machinery, or drink alcohol with this medication as it may make you drowsy.    Follow up with your primary care provider or an orthopedist if your pain is not improving.     

## 2021-11-10 ENCOUNTER — Other Ambulatory Visit: Payer: Self-pay

## 2021-11-10 ENCOUNTER — Encounter: Payer: Self-pay | Admitting: Emergency Medicine

## 2021-11-10 ENCOUNTER — Ambulatory Visit
Admission: EM | Admit: 2021-11-10 | Discharge: 2021-11-10 | Disposition: A | Discharge status: Home / Self Care | Payer: Self-pay | Attending: Urgent Care | Admitting: Urgent Care

## 2021-11-10 DIAGNOSIS — M542 Cervicalgia: Secondary | ICD-10-CM

## 2021-11-10 DIAGNOSIS — M545 Low back pain, unspecified: Secondary | ICD-10-CM

## 2021-11-10 DIAGNOSIS — J3089 Other allergic rhinitis: Secondary | ICD-10-CM

## 2021-11-10 DIAGNOSIS — M549 Dorsalgia, unspecified: Secondary | ICD-10-CM

## 2021-11-10 MED ORDER — FLUTICASONE PROPIONATE 50 MCG/ACT NA SUSP: 2.0000 | Freq: Every day | NASAL | 12 refills | Status: AC

## 2021-11-10 MED ORDER — NAPROXEN 500 MG PO TABS: 500.0000 mg | ORAL_TABLET | Freq: Two times a day (BID) | ORAL | 0 refills | Status: AC

## 2021-11-10 MED ORDER — TIZANIDINE HCL 4 MG PO TABS: 4.0000 mg | ORAL_TABLET | Freq: Every day | ORAL | 0 refills | Status: AC

## 2021-11-10 MED ORDER — CETIRIZINE HCL 10 MG PO TABS: 10.0000 mg | ORAL_TABLET | Freq: Every day | ORAL | 0 refills | Status: AC
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
## 2021-12-03 ENCOUNTER — Encounter (HOSPITAL_COMMUNITY): Payer: Self-pay | Admitting: Emergency Medicine

## 2021-12-03 ENCOUNTER — Emergency Department (HOSPITAL_COMMUNITY)
Admission: EM | Admit: 2021-12-03 | Discharge: 2021-12-03 | Disposition: A | Discharge status: Home / Self Care | Payer: No Typology Code available for payment source | Attending: Emergency Medicine | Admitting: Emergency Medicine

## 2021-12-03 ENCOUNTER — Emergency Department (HOSPITAL_COMMUNITY): Payer: No Typology Code available for payment source

## 2021-12-03 DIAGNOSIS — M542 Cervicalgia: Secondary | ICD-10-CM | POA: Diagnosis not present

## 2021-12-03 DIAGNOSIS — Y92410 Unspecified street and highway as the place of occurrence of the external cause: Secondary | ICD-10-CM | POA: Diagnosis not present

## 2021-12-03 MED ORDER — IBUPROFEN 800 MG PO TABS
800.0000 mg | ORAL_TABLET | Freq: Once | ORAL | Status: AC
  Administered 2021-12-03: 800 mg via ORAL
  Filled 2021-12-03: qty 1

## 2021-12-03 MED ORDER — IBUPROFEN 600 MG PO TABS: 600.0000 mg | ORAL_TABLET | Freq: Four times a day (QID) | ORAL | 0 refills | Status: AC | PRN

## 2021-12-03 MED ORDER — METHOCARBAMOL 500 MG PO TABS: 500.0000 mg | ORAL_TABLET | Freq: Three times a day (TID) | ORAL | 0 refills | Status: AC | PRN
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
